# Patient Record
Sex: Male | Born: 1965 | Race: White | Hispanic: No | Marital: Single | Smoking: Never smoker
Health system: Southern US, Community
[De-identification: ages and names within clinical notes are randomized; demographics above are authoritative.]

## PROBLEM LIST (undated history)

## (undated) HISTORY — PX: HERNIA REPAIR: SHX51

## (undated) HISTORY — PX: JOINT REPLACEMENT: SHX530

---

## 2013-02-09 ENCOUNTER — Ambulatory Visit (INDEPENDENT_AMBULATORY_CARE_PROVIDER_SITE_OTHER): Payer: Managed Care, Other (non HMO) | Admitting: Emergency Medicine

## 2013-02-09 VITALS — BP 128/84 | HR 69 | Temp 98.0°F | Resp 17 | Ht 68.5 in | Wt 184.0 lb

## 2013-02-09 DIAGNOSIS — I10 Essential (primary) hypertension: Secondary | ICD-10-CM

## 2013-02-09 DIAGNOSIS — E789 Disorder of lipoprotein metabolism, unspecified: Secondary | ICD-10-CM

## 2013-02-09 MED ORDER — LISINOPRIL 20 MG PO TABS: 20.0000 mg | ORAL_TABLET | Freq: Every day | ORAL | Status: DC

## 2013-02-09 NOTE — Progress Notes (Signed)
  Subjective:    Patient ID: Dustin Campbell, male    DOB: 1966-10-18, 47 y.o.   MRN: [id number]  HPI Patient presents for refill of blood pressure medication.  States he just moved to area from [facility] and was taking Lisinopril 20mg .  Patient states he eats well and works out at [employer]  Patient states he has been taking medication and ran out a couple days ago.  Upon taking, blood pressure is 140/98.  Patient states he has high cholesterol treated by diet and exercise.  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 Advised patient to schedule an appointment for a physical at our building next door.      Review of Systems     Objective:   Physical Exam HEENT exam reveals a mild asymmetry of his eyes. His neck is supple without thyromegaly his chest is clear to both auscultation and percussion. Cardiac exam reveals a regular rate without murmurs rubs or gallops. The abdomen is soft nontender extremities are without edema.       Assessment & Plan:

## 2013-02-09 NOTE — Patient Instructions (Addendum)
Please call remain off this and make an appointment for a physical exam in approximately 6 months. He can do your fasting blood work at that time

## 2013-11-10 ENCOUNTER — Ambulatory Visit: Payer: BC Managed Care – PPO | Admitting: Family Medicine

## 2013-11-10 VITALS — BP 125/82 | HR 66 | Temp 98.5°F | Resp 17 | Ht 68.5 in | Wt 180.0 lb

## 2013-11-10 DIAGNOSIS — F329 Major depressive disorder, single episode, unspecified: Secondary | ICD-10-CM

## 2013-11-10 DIAGNOSIS — I10 Essential (primary) hypertension: Secondary | ICD-10-CM

## 2013-11-10 DIAGNOSIS — R5383 Other fatigue: Secondary | ICD-10-CM

## 2013-11-10 DIAGNOSIS — R5381 Other malaise: Secondary | ICD-10-CM

## 2013-11-10 DIAGNOSIS — R0609 Other forms of dyspnea: Secondary | ICD-10-CM

## 2013-11-10 DIAGNOSIS — Z8249 Family history of ischemic heart disease and other diseases of the circulatory system: Secondary | ICD-10-CM

## 2013-11-10 DIAGNOSIS — G479 Sleep disorder, unspecified: Secondary | ICD-10-CM

## 2013-11-10 DIAGNOSIS — F32A Depression, unspecified: Secondary | ICD-10-CM

## 2013-11-10 DIAGNOSIS — R0600 Dyspnea, unspecified: Secondary | ICD-10-CM

## 2013-11-10 LAB — COMPREHENSIVE METABOLIC PANEL
ALT: 24 U/L (ref 0–53)
AST: 20 U/L (ref 0–37)
Albumin: 4.6 g/dL (ref 3.5–5.2)
Alkaline Phosphatase: 78 U/L (ref 39–117)
Calcium: 9.5 mg/dL (ref 8.4–10.5)
Chloride: 104 mEq/L (ref 96–112)
Potassium: 4.4 mEq/L (ref 3.5–5.3)
Sodium: 140 mEq/L (ref 135–145)
Total Protein: 7 g/dL (ref 6.0–8.3)

## 2013-11-10 LAB — POCT CBC
Granulocyte percent: 68.4 %G (ref 37–80)
MID (cbc): 0.4 (ref 0–0.9)
MPV: 10.1 fL (ref 0–99.8)
POC Granulocyte: 4.7 (ref 2–6.9)
POC LYMPH PERCENT: 26 %L (ref 10–50)
Platelet Count, POC: 171 10*3/uL (ref 142–424)
RBC: 4.96 M/uL (ref 4.69–6.13)
RDW, POC: 13.7 %

## 2013-11-10 LAB — TSH: TSH: 0.883 u[IU]/mL (ref 0.350–4.500)

## 2013-11-10 LAB — LIPID PANEL
HDL: 32 mg/dL — ABNORMAL LOW (ref >39)
LDL Cholesterol: 109 mg/dL — ABNORMAL HIGH (ref 0–99)

## 2013-11-10 MED ORDER — CITALOPRAM HYDROBROMIDE 20 MG PO TABS: 20.0000 mg | ORAL_TABLET | Freq: Every day | ORAL | Status: AC

## 2013-11-10 MED ORDER — LISINOPRIL 20 MG PO TABS: 20.0000 mg | ORAL_TABLET | Freq: Every day | ORAL | Status: DC

## 2013-11-10 MED ORDER — ZOLPIDEM TARTRATE 5 MG PO TABS: 5.0000 mg | ORAL_TABLET | Freq: Every evening | ORAL | Status: AC | PRN

## 2013-11-10 NOTE — Progress Notes (Signed)
Subjective: Patient is here for his blood pressure checked. He is concerned because he had more exertional fatigue. He works for about 3 AM until then, then has afternoon and evening off. He works out every day. He says he can't do prolonged exertional exercise anymore without getting fatigued and short of breath. He is concerned because there's a strong family history of coronary artery disease both parents. He has not any chest pains. He is under stress. He has been divorced for about 7 years. He moved to [place] years ago to be closer his daughters who were with her mother and degree. He sees them every other weekend, but he cannot always afford to go get them. Finances are extremely tight for him. 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 He goes to bed about 11-12 and gets up at 3 AM. There has been sufficient for him in the past, but does not seem to be doing the job for now. He says he thinks he is depressed. He does not feel his usual self. He worries about finances. He worries about his children. He isn't happy like he was. He lives with his girlfriend. He says the relationship is generally good.  Objective White male no acute distress. TMs normal. Throat clear. Neck supple without nodes thyromegaly. Chest clear. Heart regular without murmurs gallops or arrhythmias. And soft without mass or tenderness. Blood pressure good today.  EKG normal  Assessment: Hypertension Depression Sleep disturbance Exertional fatigue and dyspnea on exertion Disturbance  Plan: Try Celexa for a while and see I does on that. Gave him some Ambien 5 mg take one half to one at bedtime when necessary sleep  Refer to cardiology for evaluation. Patient wanted a stress test, but I felt like it would be better to let a cardiologist see him.  Results for orders placed in visit on 11/10/13  POCT CBC      Result Value Range   WBC 6.9  4.6 - 10.2 K/uL   Lymph, poc 1.8  0.6 - 3.4   POC LYMPH PERCENT  26.0  10 - 50 %L   MID (cbc) 0.4  0 - 0.9   POC MID % 5.6  0 - 12 %M   POC Granulocyte 4.7  2 - 6.9   Granulocyte percent 68.4  37 - 80 %G   RBC 4.96  4.69 - 6.13 M/uL   Hemoglobin 14.8  14.1 - 18.1 g/dL   HCT, POC 82.9  56.2 - 53.7 %   MCV 93.5  80 - 97 fL   MCH, POC 29.8  27 - 31.2 pg   MCHC 31.9  31.8 - 35.4 g/dL   RDW, POC 13.0     Platelet Count, POC 171  142 - 424 K/uL   MPV 10.1  0 - 99.8 fL     Return in one month

## 2013-11-10 NOTE — Patient Instructions (Addendum)
Take the citalopram one daily for depression. Most people do best to take in the morning. If it makes her feel drowsy he can take it in the evening.  Take the zolpedim one half to one tablet one half hour before bedtime as a sleep aid when needed.  Continue your blood pressure medication one daily  Referral is being made to a cardiologist for further evaluation as per our discussion.  I will let him know the results of your additional labs in a few days.

## 2013-11-11 ENCOUNTER — Encounter: Payer: Self-pay | Admitting: Family Medicine

## 2013-11-17 ENCOUNTER — Encounter: Payer: Self-pay | Admitting: Cardiology

## 2013-11-17 ENCOUNTER — Other Ambulatory Visit: Payer: Self-pay | Admitting: Cardiology

## 2013-11-17 ENCOUNTER — Ambulatory Visit
Admission: RE | Admit: 2013-11-17 | Discharge: 2013-11-17 | Disposition: A | Discharge status: Home / Self Care | Payer: BC Managed Care – PPO | Source: Ambulatory Visit | Attending: Cardiology | Admitting: Cardiology

## 2013-11-17 DIAGNOSIS — R0602 Shortness of breath: Secondary | ICD-10-CM

## 2013-11-17 NOTE — Progress Notes (Signed)
Patient ID: Dustin Campbell, male   DOB: Dec 14, 1965, 47 y.o.   MRN: [id number]   [place], Sprong    Date of visit:  11/17/2013 DOB:  1966-10-09    Age:  47 yrs. Medical record number:  [id number]     Account number:  [id number] Primary Care Provider: HOPPER,DAVID ____________________________ CURRENT DIAGNOSES  1. Dyspnea  2. Hypertension,Essential (Benign) ____________________________ ALLERGIES  No Known Allergies ____________________________ MEDICATIONS  1. lisinopril 20 mg tablet, 1 p.o. daily  2. citalopram 20 mg tablet, 1 p.o. daily  3. zolpidem 5 mg tablet, QHS ____________________________ CHIEF COMPLAINTS  Dyspnea with exercise ____________________________ HISTORY OF PRESENT ILLNESS Patient seen for cardiac consultation for evaluation of dyspnea. 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 He normally works out including weight lifting and exercise. He complains of dyspnea with exertion that is unexplained and he has been concerned about the dyspnea particularly in light of the fact he has a family history of cardiac disease. He describes dyspnea if he does significant walking or work but does not have any anginal pain. He has no PND, orthopnea or significant edema. He has no significant claudication. He has been divorced for the past 5 years and moved down here to be near his daughters and has been under some situational stress and may have some depression going on. ____________________________ PAST HISTORY  Past Medical Illnesses:  hypertension, depression;  Cardiovascular Illnesses:  no previous history of cardiac disease.;  Surgical Procedures:  inguinal herniorrhaphy-rt, inguinal herniorrhaphy-left, cyst l eyelid;  Cardiology Procedures-Invasive:  no history of prior cardiac procedures;  Cardiology Procedures-Noninvasive:  treadmill;  LVEF not documented,   ____________________________ CARDIO-PULMONARY TEST  DATES EKG Date:  11/17/2013;  Chest Xray Date: 11/17/2013;   ____________________________ FAMILY HISTORY Father -- Father dead, Cancer, Hypertension, Coronary Artery Disease Mother -- Mother alive with problem, Coronary Artery Disease Sister -- Sister alive and well Sister -- Sister alive and well ____________________________ SOCIAL HISTORY Alcohol Use:  occasionally;  Smoking:  used to smoke but quit 1990;  Diet:  regular diet;  Lifestyle:  divorced;  Exercise:  aerobics greater than 90 minutes 5 days per week and weight lifting greater than 90 minutes 5 days per week;  Occupation:  loading dock and [employer];   ____________________________ REVIEW OF SYSTEMS General:  denies recent weight change, fatique or change in exercise tolerance.  Integumentary:no rashes or new skin lesions. Eyes: denies diplopia, history of glaucoma or visual problems. Ears, Nose, Throat, Mouth:  denies any hearing loss, epistaxis, hoarseness or difficulty speaking. Respiratory: denies dyspnea, cough, wheezing or hemoptysis. Cardiovascular:  please review HPI Abdominal: abdominal pain Genitourinary-Male: no dysuria, urgency, frequency, or nocturia  Musculoskeletal:  denies arthritis, venous insufficiency, or muscle weakness. Neurological:  denies headaches, stroke, or TIA Psychiatric:  situational stress Hematological/Immunologic:  denies any food allergies, bleeding disorders. ____________________________ PHYSICAL EXAMINATION VITAL SIGNS  Blood Pressure:  128/70 Sitting, Right arm, regular cuff  , 124/70 Standing, Right arm and regular cuff   Pulse:  72/min. Weight:  178.00 lbs. Height:  68"BMI: 27  Constitutional:  pleasant white male in no acute distress Skin:  warm and dry to touch, no apparent skin lesions, or masses noted. Head:  normocephalic, normal hair pattern, no masses or tenderness Eyes:  EOMS Intact, PERRLA, C and S clear, Funduscopic exam not done. ENT:  ears, nose and throat reveal no gross  abnormalities.  Dentition good. Neck:  supple, without massess. No JVD, thyromegaly  or carotid bruits. Carotid upstroke normal. Chest:  normal symmetry, clear to auscultation  Cardiac:  regular rhythm, normal S1 and S2, No S3 or S4, no murmurs, gallops or rubs detected. Abdomen:  abdomen soft,non-tender, no masses, no hepatospenomegaly, or aneurysm noted Peripheral Pulses:  the femoral,dorsalis pedis, and posterior tibial pulses are full and equal bilaterally with no bruits auscultated. Extremities & Back:  no deformities, clubbing, cyanosis, erythema or edema observed. Normal muscle strength and tone. Neurological:  no gross motor or sensory deficits noted, affect appropriate, oriented x3. ____________________________ MOST RECENT LIPID PANEL 11/10/13  CHOL TOTL 205 mg/dl, LDL 962 NM, HDL 32 mg/dl, TRIGLYCER 952 mg/dl, ALT 24 u/l, ALK PHOS 78 u/l, CHOL/HDL 6.4 (Calc) and AST 20 u/l ____________________________ IMPRESSIONS/PLAN  1. Unexplained dyspnea in a patient with several cardiac risk factors 2. Hyperlipidemia 3. Hypertension  Recommendations:  I will like for the patient to have a chest x-ray and for him to have an exercise treadmill. His EKG is normal. In addition I would like for him to have an echocardiogram to assess his left ventricular wall thickness, and diastolic function and to evaluate for any other causes of dyspnea that could be coming from his heart. Thank you for asking me to see him with you. ____________________________ TODAYS ORDERS  1. 12 Lead EKG: Today  2. 2D, color flow, doppler: First Available  3. treadmill:  Regular TM First Available  4. Chest X-ray PA/Lat: today                       ____________________________ Cardiology Physician:  Darden Palmer MD [facility]

## 2015-01-19 IMAGING — CR DG CHEST 2V
2 series · 2 of 2 positions shown · non-contrast
Comparison: None.

CLINICAL DATA: Shortness of Breath

EXAM:
CHEST  2 VIEW

[w chest pa]
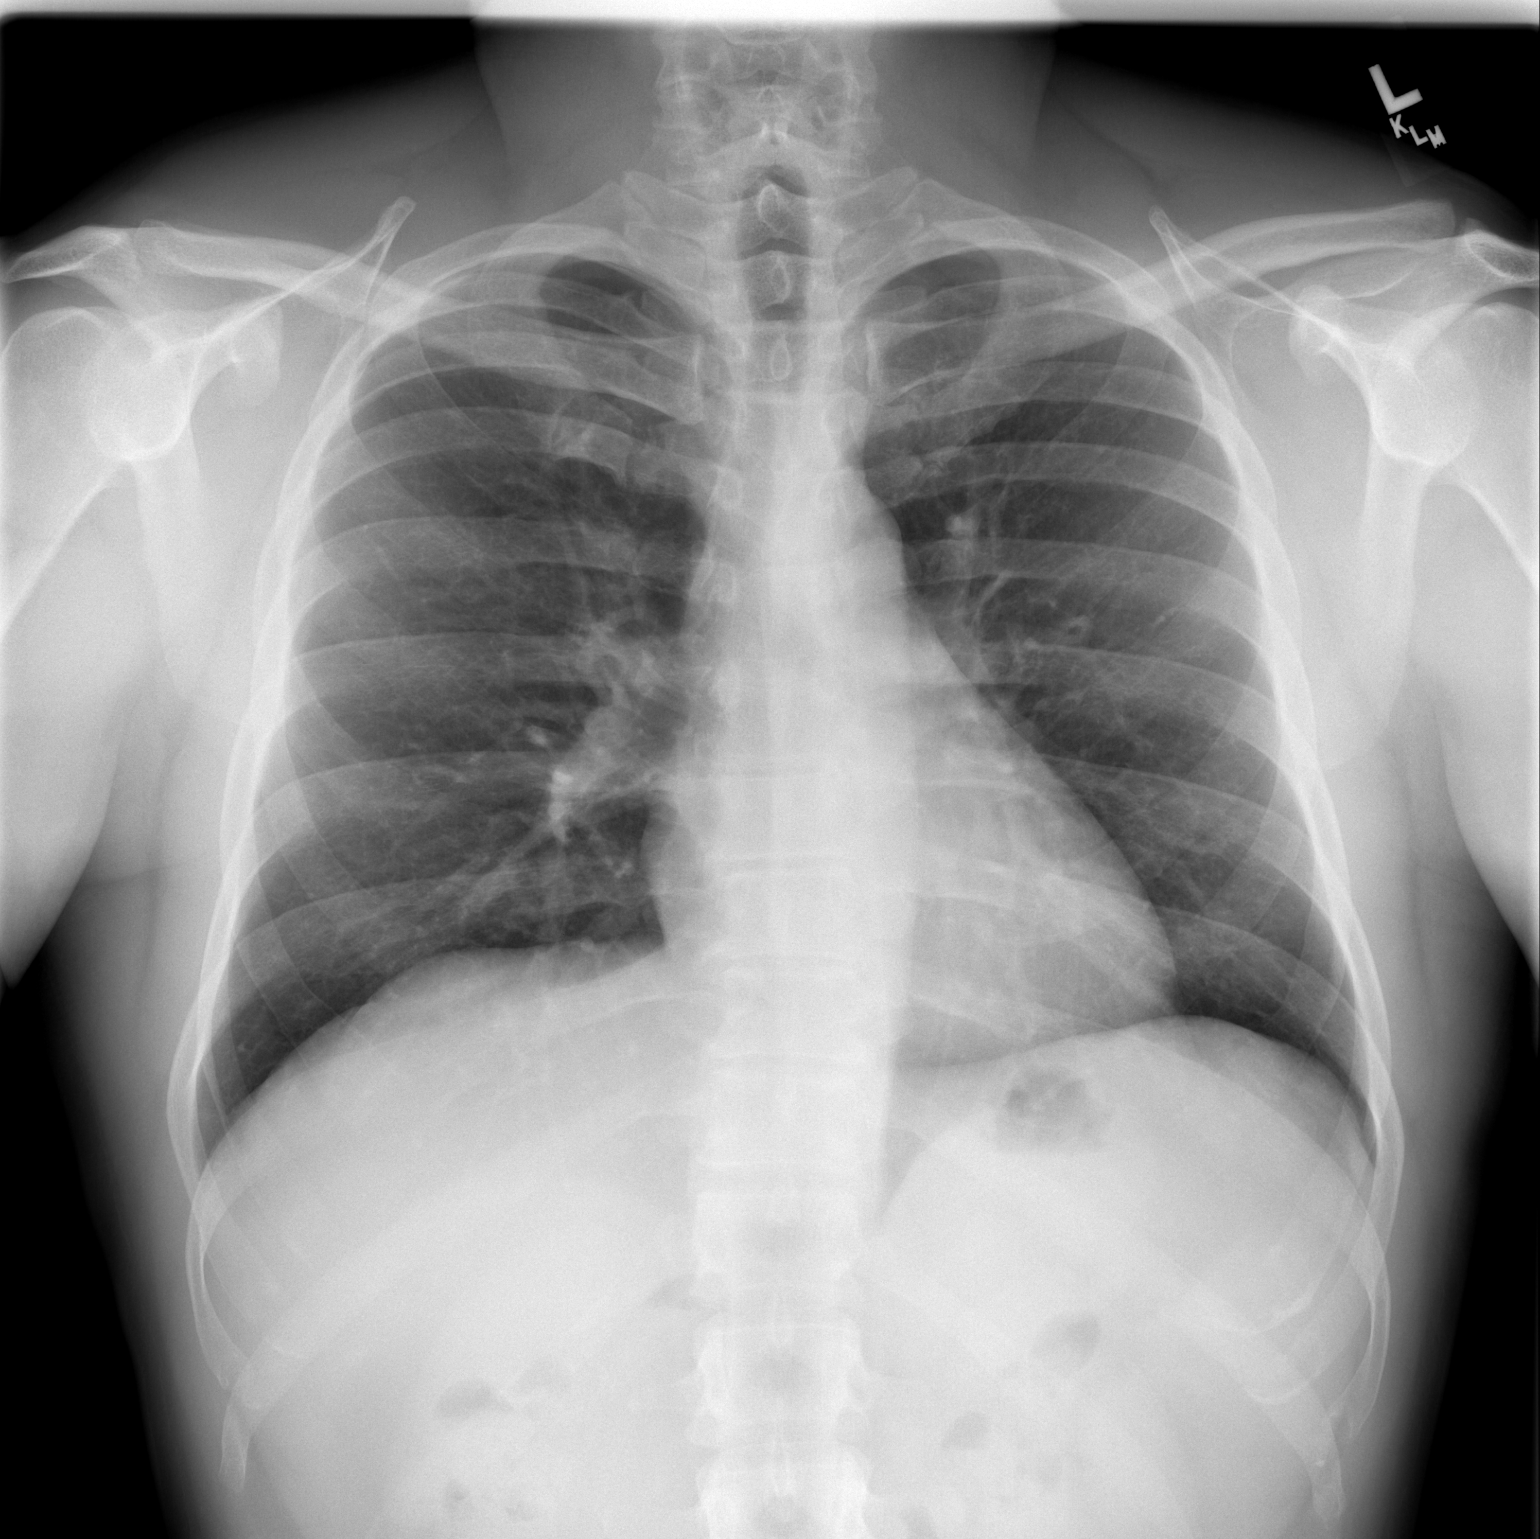

[w chest lat]
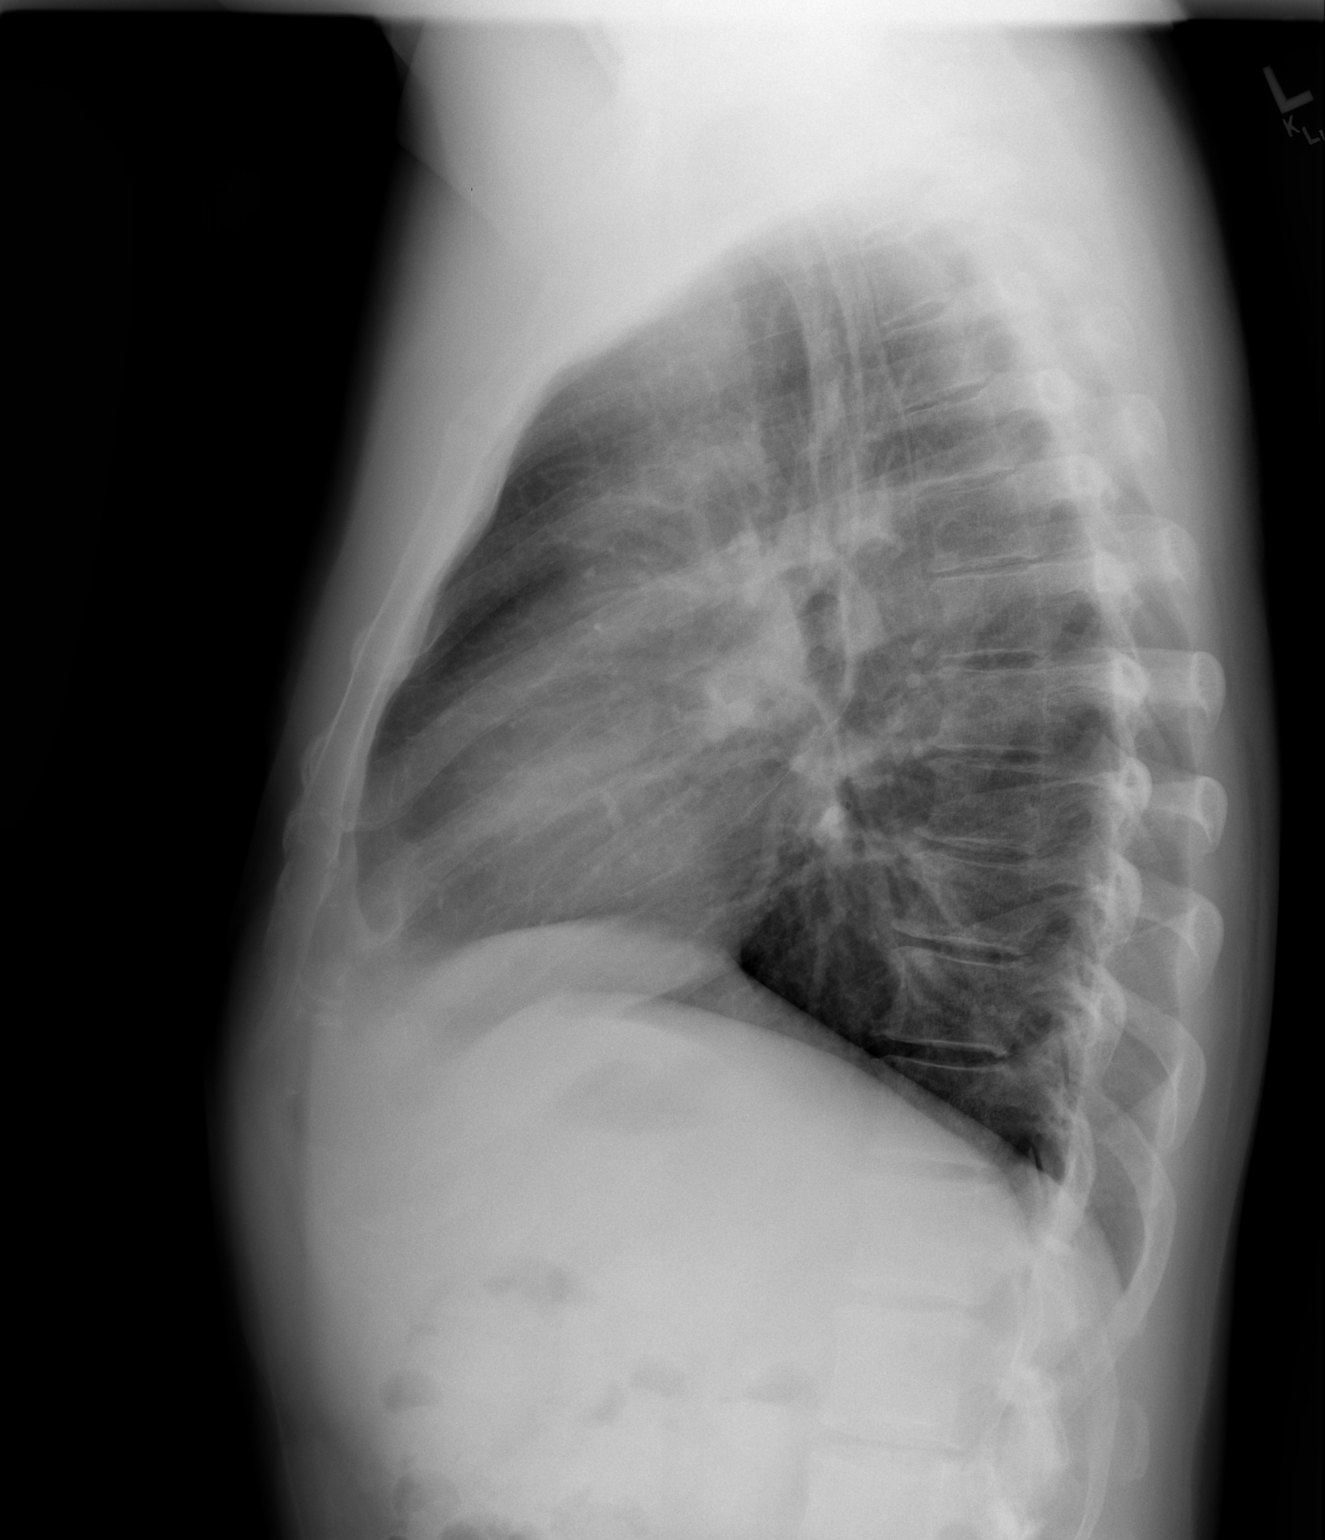

[2 of 2 positions shown; findings below may reference images not displayed]

FINDINGS: Lungs are clear. Heart size and pulmonary vascularity are normal. No
adenopathy. No pneumothorax. No bone lesions.
IMPRESSION: No abnormality noted.

## 2018-11-23 DIAGNOSIS — I10 Essential (primary) hypertension: Secondary | ICD-10-CM | POA: Diagnosis not present

## 2018-11-23 DIAGNOSIS — E7800 Pure hypercholesterolemia, unspecified: Secondary | ICD-10-CM | POA: Diagnosis not present

## 2018-11-23 DIAGNOSIS — N183 Chronic kidney disease, stage 3 (moderate): Secondary | ICD-10-CM | POA: Diagnosis not present

## 2019-04-12 DIAGNOSIS — M9905 Segmental and somatic dysfunction of pelvic region: Secondary | ICD-10-CM | POA: Diagnosis not present

## 2019-04-12 DIAGNOSIS — S336XXA Sprain of sacroiliac joint, initial encounter: Secondary | ICD-10-CM | POA: Diagnosis not present

## 2019-04-12 DIAGNOSIS — M9904 Segmental and somatic dysfunction of sacral region: Secondary | ICD-10-CM | POA: Diagnosis not present

## 2019-04-12 DIAGNOSIS — M9903 Segmental and somatic dysfunction of lumbar region: Secondary | ICD-10-CM | POA: Diagnosis not present

## 2019-05-25 DIAGNOSIS — Z1159 Encounter for screening for other viral diseases: Secondary | ICD-10-CM | POA: Diagnosis not present

## 2019-05-25 DIAGNOSIS — E7800 Pure hypercholesterolemia, unspecified: Secondary | ICD-10-CM | POA: Diagnosis not present

## 2019-05-25 DIAGNOSIS — Z23 Encounter for immunization: Secondary | ICD-10-CM | POA: Diagnosis not present

## 2019-05-25 DIAGNOSIS — I10 Essential (primary) hypertension: Secondary | ICD-10-CM | POA: Diagnosis not present

## 2019-05-25 DIAGNOSIS — Z125 Encounter for screening for malignant neoplasm of prostate: Secondary | ICD-10-CM | POA: Diagnosis not present

## 2019-05-25 DIAGNOSIS — Z0000 Encounter for general adult medical examination without abnormal findings: Secondary | ICD-10-CM | POA: Diagnosis not present

## 2019-05-26 DIAGNOSIS — Z1211 Encounter for screening for malignant neoplasm of colon: Secondary | ICD-10-CM | POA: Diagnosis not present

## 2019-11-24 DIAGNOSIS — I10 Essential (primary) hypertension: Secondary | ICD-10-CM | POA: Diagnosis not present

## 2019-11-24 DIAGNOSIS — E782 Mixed hyperlipidemia: Secondary | ICD-10-CM | POA: Diagnosis not present

## 2019-11-24 DIAGNOSIS — L918 Other hypertrophic disorders of the skin: Secondary | ICD-10-CM | POA: Diagnosis not present

## 2019-12-18 DIAGNOSIS — Z03818 Encounter for observation for suspected exposure to other biological agents ruled out: Secondary | ICD-10-CM | POA: Diagnosis not present

## 2019-12-29 DIAGNOSIS — L918 Other hypertrophic disorders of the skin: Secondary | ICD-10-CM | POA: Diagnosis not present

## 2023-02-15 ENCOUNTER — Emergency Department (HOSPITAL_BASED_OUTPATIENT_CLINIC_OR_DEPARTMENT_OTHER)
Admission: EM | Admit: 2023-02-15 | Discharge: 2023-02-15 | Disposition: A | Discharge status: Home / Self Care | Payer: BLUE CROSS/BLUE SHIELD | Attending: Emergency Medicine | Admitting: Emergency Medicine

## 2023-02-15 ENCOUNTER — Encounter (HOSPITAL_BASED_OUTPATIENT_CLINIC_OR_DEPARTMENT_OTHER): Payer: Self-pay

## 2023-02-15 ENCOUNTER — Emergency Department (HOSPITAL_BASED_OUTPATIENT_CLINIC_OR_DEPARTMENT_OTHER): Payer: BLUE CROSS/BLUE SHIELD

## 2023-02-15 ENCOUNTER — Other Ambulatory Visit: Payer: Self-pay

## 2023-02-15 DIAGNOSIS — R10814 Left lower quadrant abdominal tenderness: Secondary | ICD-10-CM | POA: Diagnosis not present

## 2023-02-15 DIAGNOSIS — I10 Essential (primary) hypertension: Secondary | ICD-10-CM

## 2023-02-15 LAB — CBC
HCT: 44.5 % (ref 39.0–52.0)
Hemoglobin: 15.9 g/dL (ref 13.0–17.0)
MCH: 29.4 pg (ref 26.0–34.0)
MCHC: 35.7 g/dL (ref 30.0–36.0)
MCV: 82.4 fL (ref 80.0–100.0)
Platelets: 173 10*3/uL (ref 150–400)
RBC: 5.4 MIL/uL (ref 4.22–5.81)
RDW: 13.4 % (ref 11.5–15.5)
WBC: 6.2 10*3/uL (ref 4.0–10.5)
nRBC: 0 % (ref 0.0–0.2)

## 2023-02-15 LAB — URINALYSIS, MICROSCOPIC (REFLEX)

## 2023-02-15 LAB — COMPREHENSIVE METABOLIC PANEL
ALT: 22 U/L (ref 0–44)
AST: 19 U/L (ref 15–41)
Albumin: 4.2 g/dL (ref 3.5–5.0)
Alkaline Phosphatase: 84 U/L (ref 38–126)
Anion gap: 7 (ref 5–15)
BUN: 21 mg/dL — ABNORMAL HIGH (ref 6–20)
CO2: 24 mmol/L (ref 22–32)
Calcium: 8.5 mg/dL — ABNORMAL LOW (ref 8.9–10.3)
Chloride: 99 mmol/L (ref 98–111)
Creatinine, Ser: 1.19 mg/dL (ref 0.61–1.24)
GFR, Estimated: >60 mL/min (ref >60)
Glucose, Bld: 96 mg/dL (ref 70–99)
Potassium: 3.2 mmol/L — ABNORMAL LOW (ref 3.5–5.1)
Sodium: 130 mmol/L — ABNORMAL LOW (ref 135–145)
Total Bilirubin: 0.7 mg/dL (ref 0.3–1.2)
Total Protein: 7 g/dL (ref 6.5–8.1)

## 2023-02-15 LAB — URINALYSIS, ROUTINE W REFLEX MICROSCOPIC
Bilirubin Urine: NEGATIVE
Glucose, UA: NEGATIVE mg/dL
Ketones, ur: NEGATIVE mg/dL
Leukocytes,Ua: NEGATIVE
Nitrite: NEGATIVE
Protein, ur: NEGATIVE mg/dL
Specific Gravity, Urine: 1.02 (ref 1.005–1.030)
pH: 5.5 (ref 5.0–8.0)

## 2023-02-15 LAB — LIPASE, BLOOD: Lipase: 37 U/L (ref 11–51)

## 2023-02-15 MED ORDER — MORPHINE SULFATE (PF) 4 MG/ML IV SOLN
4.0000 mg | Freq: Once | INTRAVENOUS | Status: AC
  Administered 2023-02-15: 4 mg via INTRAVENOUS
  Filled 2023-02-15: qty 1

## 2023-02-15 MED ORDER — IOHEXOL 300 MG/ML  SOLN
100.0000 mL | Freq: Once | INTRAMUSCULAR | Status: AC | PRN
  Administered 2023-02-15: 100 mL via INTRAVENOUS

## 2023-02-15 MED ORDER — LISINOPRIL 20 MG PO TABS: 20.0000 mg | ORAL_TABLET | Freq: Every day | ORAL | 0 refills | Status: AC

## 2023-02-15 NOTE — ED Triage Notes (Signed)
Pt reports hx of right inguinal hernia with 2 surgeries. Reports pain began on Friday on left lower abdomen and concerned it may be hernia . Pain started after doing yard work on Thursday

## 2023-02-15 NOTE — Discharge Instructions (Signed)
You do not have a new hernia on the left.  Keep taking ibuprofen and Tylenol for your left-sided pain, you could have had a muscle sprain or spasm.  I refilled a short supply of your blood pressure medicine, but it would be best to establish with a new primary care doctor who can refill this.  You can check the information in your discharge paperwork.

## 2023-02-15 NOTE — ED Provider Notes (Signed)
[facility] Provider Note   CSN: [id number] Arrival date & time: 02/15/23  1315     History  Chief Complaint  Patient presents with   Abdominal Pain    Dustin Campbell is a 57 y.o. male with PMH HTN, 2 prior right-sided hernia repairs coming in with sudden onset left lower quadrant/left groin pain that started a few days ago.  He said he was doing yard work and lifting heavy objects at that time and then suddenly started feeling sharp left lower quadrant/left groin pain.  He has tried Tylenol and ibuprofen at home over the last few days and has had some improvement in the pain with those medicines.  He says his pain is worse in the morning right after he wakes up but gets better as he loosens up.  He has had no nausea or vomiting, still tolerating p.o. intake, still having regular bowel movements, 1 a day.  He additionally does have a right-sided groin mass above the inguinal ligament that he says has been there for about 10 to 15 years.  It is not painful.  He says it popped up after his prior mesh repairs but that it has not bothered him so he has not gotten it checked.  He previously followed with a primary doctor for his medical conditions but has not seen one in a while.  He currently is not having any lower abdominal pain.       Home Medications Prior to Admission medications   Medication Sig Start Date End Date Taking? Authorizing Provider  citalopram (CELEXA) 20 MG tablet Take 1 tablet (20 mg total) by mouth daily. 11/10/13   Posey Boyer, MD  lisinopril (ZESTRIL) 20 MG tablet Take 1 tablet (20 mg total) by mouth daily. 02/15/23   Linus Galas, MD  zolpidem (AMBIEN) 5 MG tablet Take 1 tablet (5 mg total) by mouth at bedtime as needed for sleep. 11/10/13   Posey Boyer, MD      Allergies    Patient has no known allergies.    Review of Systems   See HPI  Physical Exam Updated Vital Signs BP (!) 166/100   Pulse (!)  55   Temp 98.1 F (36.7 C) (Oral)   Resp 18   Ht '5\' 8"'$  (1.727 m)   Wt 83.9 kg   SpO2 96%   BMI 28.13 kg/m  Physical Exam Constitutional:      General: He is not in acute distress.    Appearance: He is not ill-appearing.  Abdominal:     General: Bowel sounds are normal.     Palpations: Abdomen is soft. There is mass (Right-sided nonreducible mass medial and superior to the inguinal ligament).     Tenderness: There is no abdominal tenderness.     Hernia: There is no hernia in the ventral area, left inguinal area or left femoral area.  Neurological:     Mental Status: He is alert.     ED Results / Procedures / Treatments   Labs (all labs ordered are listed, but only abnormal results are displayed) Labs Reviewed  COMPREHENSIVE METABOLIC PANEL - Abnormal; Notable for the following components:      Result Value   Sodium 130 (*)    Potassium 3.2 (*)    BUN 21 (*)    Calcium 8.5 (*)    All other components within normal limits  URINALYSIS, ROUTINE W REFLEX MICROSCOPIC - Abnormal; Notable for the following components:  Hgb urine dipstick TRACE (*)    All other components within normal limits  URINALYSIS, MICROSCOPIC (REFLEX) - Abnormal; Notable for the following components:   Bacteria, UA RARE (*)    All other components within normal limits  LIPASE, BLOOD  CBC    EKG None  Radiology CT ABDOMEN PELVIS W CONTRAST  Result Date: 02/15/2023 CLINICAL DATA:  History of bilateral inguinal hernias status post 2 surgeries. Pain began on Friday in the left lower abdomen after doing yard work. Concern for recurrent hernia. EXAM: CT ABDOMEN AND PELVIS WITH CONTRAST TECHNIQUE: Multidetector CT imaging of the abdomen and pelvis was performed using the standard protocol following bolus administration of intravenous contrast. RADIATION DOSE REDUCTION: This exam was performed according to the departmental dose-optimization program which includes automated exposure control, adjustment of the  mA and/or kV according to patient size and/or use of iterative reconstruction technique. CONTRAST:  183m OMNIPAQUE IOHEXOL 300 MG/ML  SOLN COMPARISON:  None Available. FINDINGS: Lower chest: No acute abnormality. Hepatobiliary: Liver, gallbladder, and biliary tree are unremarkable. Pancreas: Unremarkable. Spleen: Unremarkable. Adrenals/Urinary Tract: Normal adrenal glands. Simple renal cysts bilaterally which do not require independent follow-up. Cortical renal scarring left kidney. No urinary calculi or hydronephrosis. Distended bladder. Stomach/Bowel: Normal caliber large and small bowel. Colonic diverticulosis without diverticulitis. Normal appendix. Unremarkable stomach. Vascular/Lymphatic: No significant vascular findings are present. No enlarged abdominal or pelvic lymph nodes. Reproductive: Enlarged prostate. Other: No free intraperitoneal fluid or air. Small fat containing right inguinal hernia. No left inguinal hernia. Tiny fat containing umbilical hernia. Musculoskeletal: No acute or significant osseous findings. IMPRESSION: 1. No acute abnormality in the abdomen or pelvis. 2. Small fat containing right inguinal hernia. No left inguinal or left abdominal wall hernia. 3. Enlarged prostate with distended bladder. Electronically Signed   By: TPlacido SouM.D.   On: 02/15/2023 19:32    Procedures   Medications Ordered in ED Medications  morphine (PF) 4 MG/ML injection 4 mg (4 mg Intravenous Given 02/15/23 1803)  iohexol (OMNIPAQUE) 300 MG/ML solution 100 mL (100 mLs Intravenous Contrast Given 02/15/23 1842)    ED Course/ Medical Decision Making/ A&P                          Medical Decision Making Amount and/or Complexity of Data Reviewed Labs: ordered. Radiology: ordered.  Risk Prescription drug management.   57year old male with past medical history of hypertension, 2 prior right-sided mesh hernia repairs presenting with sudden onset left lower quadrant/left groin intermittent  abdominal pain over the past few days.  He is still passing bowel movements and has had no of nausea or vomiting.  He denies a left inguinal mass, but has had a right inguinal mass that does not bother him for 10 to 15 years after his prior repairs.  He mostly wanted to be checked for left-sided hernia.  Initial DDx includes inguinal hernia (new left and possible chronic right), left abdominal muscle sprain or muscle spasm.  He does not have any palpable left-sided hernias on exam, but does have a chronic right-sided nonreducible mass.  Will further evaluate with CT abdomen pelvis.  Update: CT shows right-sided small fat-containing inguinal hernia and no left-sided inguinal hernia.  He also has enlarged prostate with distended bladder.  Nothing to do emergently, will discharge him and have him follow-up with a PCP in the outpatient setting after he establishes care.  Of note, he is also hypertensive and he has previously stopped taking  his lisinopril 20.  I will refill his prescription for 30 days and encouraged him to establish care with a new primary care doctor and continue lifestyle modifications.   Final Clinical Impression(s) / ED Diagnoses Final diagnoses:  Essential hypertension    Rx / DC Orders ED Discharge Orders          Ordered    lisinopril (ZESTRIL) 20 MG tablet  Daily        02/15/23 1954              Linus Galas, MD 02/15/23 1956    Drenda Freeze, MD 02/15/23 [id number]
